# Patient Record
Sex: Male | Born: 1950 | Race: Black or African American | Hispanic: No | Marital: Married
Health system: Southern US, Community
[De-identification: ages and names within clinical notes are randomized; demographics above are authoritative.]

## PROBLEM LIST (undated history)

## (undated) DIAGNOSIS — I10 Essential (primary) hypertension: Secondary | ICD-10-CM

---

## 2004-05-19 ENCOUNTER — Ambulatory Visit: Payer: Self-pay

## 2004-05-19 ENCOUNTER — Other Ambulatory Visit: Payer: Self-pay

## 2004-05-22 ENCOUNTER — Other Ambulatory Visit: Payer: Self-pay

## 2004-05-22 ENCOUNTER — Inpatient Hospital Stay: Payer: Self-pay

## 2004-06-07 ENCOUNTER — Ambulatory Visit: Payer: Self-pay

## 2005-05-26 ENCOUNTER — Emergency Department: Payer: Self-pay | Admitting: Emergency Medicine

## 2005-05-26 ENCOUNTER — Other Ambulatory Visit: Payer: Self-pay

## 2006-02-13 ENCOUNTER — Ambulatory Visit: Payer: Self-pay | Admitting: Internal Medicine

## 2007-03-01 ENCOUNTER — Emergency Department: Payer: Self-pay | Admitting: Emergency Medicine

## 2007-06-14 ENCOUNTER — Emergency Department: Payer: Self-pay | Admitting: Emergency Medicine

## 2009-12-06 ENCOUNTER — Emergency Department: Payer: Self-pay | Admitting: Emergency Medicine

## 2011-10-23 ENCOUNTER — Inpatient Hospital Stay: Payer: Self-pay | Admitting: Internal Medicine

## 2011-10-23 DIAGNOSIS — J9600 Acute respiratory failure, unspecified whether with hypoxia or hypercapnia: Secondary | ICD-10-CM

## 2011-10-23 LAB — URINALYSIS, COMPLETE
Ketone: NEGATIVE
Leukocyte Esterase: NEGATIVE
Nitrite: NEGATIVE
Ph: 5 (ref 4.5–8.0)
Protein: >=500
Squamous Epithelial: NONE SEEN
WBC UR: 1 /HPF (ref 0–5)

## 2011-10-23 LAB — COMPREHENSIVE METABOLIC PANEL
Albumin: 3.6 g/dL (ref 3.4–5.0)
Bilirubin,Total: 0.8 mg/dL (ref 0.2–1.0)
Calcium, Total: 8.2 mg/dL — ABNORMAL LOW (ref 8.5–10.1)
Chloride: 107 mmol/L (ref 98–107)
Co2: 20 mmol/L — ABNORMAL LOW (ref 21–32)
EGFR (African American): 35 — ABNORMAL LOW
EGFR (Non-African Amer.): 30 — ABNORMAL LOW
Glucose: 175 mg/dL — ABNORMAL HIGH (ref 65–99)
Potassium: 3.4 mmol/L — ABNORMAL LOW (ref 3.5–5.1)
SGOT(AST): 37 U/L (ref 15–37)
SGPT (ALT): 51 U/L (ref 12–78)

## 2011-10-23 LAB — PROTIME-INR
INR: 1.1
Prothrombin Time: 14.7 secs (ref 11.5–14.7)

## 2011-10-23 LAB — CBC
MCHC: 31.9 g/dL — ABNORMAL LOW (ref 32.0–36.0)
Platelet: 229 10*3/uL (ref 150–440)
RBC: 4.89 10*6/uL (ref 4.40–5.90)
RDW: 15.9 % — ABNORMAL HIGH (ref 11.5–14.5)
WBC: 26.1 10*3/uL — ABNORMAL HIGH (ref 3.8–10.6)

## 2011-10-23 LAB — BASIC METABOLIC PANEL
BUN: 27 mg/dL — ABNORMAL HIGH (ref 7–18)
Calcium, Total: 8 mg/dL — ABNORMAL LOW (ref 8.5–10.1)
Co2: 22 mmol/L (ref 21–32)
EGFR (Non-African Amer.): 28 — ABNORMAL LOW
Glucose: 144 mg/dL — ABNORMAL HIGH (ref 65–99)
Osmolality: 293 (ref 275–301)
Sodium: 143 mmol/L (ref 136–145)

## 2011-10-23 LAB — PRO B NATRIURETIC PEPTIDE: B-Type Natriuretic Peptide: 2278 pg/mL — ABNORMAL HIGH (ref 0–125)

## 2011-10-24 LAB — CBC WITH DIFFERENTIAL/PLATELET
Basophil #: 0.1 10*3/uL (ref 0.0–0.1)
Basophil %: 0.4 %
Eosinophil #: 0 10*3/uL (ref 0.0–0.7)
Lymphocyte #: 0.5 10*3/uL — ABNORMAL LOW (ref 1.0–3.6)
Lymphocyte %: 3.5 %
MCH: 27.1 pg (ref 26.0–34.0)
Monocyte %: 6.9 %
Neutrophil #: 12.9 10*3/uL — ABNORMAL HIGH (ref 1.4–6.5)
Neutrophil %: 89.1 %
RBC: 3.77 10*6/uL — ABNORMAL LOW (ref 4.40–5.90)
RDW: 16.2 % — ABNORMAL HIGH (ref 11.5–14.5)
WBC: 14.5 10*3/uL — ABNORMAL HIGH (ref 3.8–10.6)

## 2011-10-24 LAB — BASIC METABOLIC PANEL
Anion Gap: 10 (ref 7–16)
BUN: 31 mg/dL — ABNORMAL HIGH (ref 7–18)
Chloride: 107 mmol/L (ref 98–107)
EGFR (African American): 26 — ABNORMAL LOW
EGFR (Non-African Amer.): 22 — ABNORMAL LOW
Osmolality: 292 (ref 275–301)
Potassium: 3.7 mmol/L (ref 3.5–5.1)
Sodium: 141 mmol/L (ref 136–145)

## 2011-10-24 LAB — PHOSPHORUS: Phosphorus: 3.3 mg/dL (ref 2.5–4.9)

## 2011-10-24 LAB — CK TOTAL AND CKMB (NOT AT ARMC)
CK, Total: 236 U/L — ABNORMAL HIGH (ref 35–232)
CK-MB: 1.2 ng/mL (ref 0.5–3.6)

## 2011-10-24 LAB — DRUG SCREEN, URINE
Amphetamines, Ur Screen: NEGATIVE (ref ?–1000)
Barbiturates, Ur Screen: NEGATIVE (ref ?–200)
Cannabinoid 50 Ng, Ur ~~LOC~~: NEGATIVE (ref ?–50)
Cocaine Metabolite,Ur ~~LOC~~: NEGATIVE (ref ?–300)
Opiate, Ur Screen: NEGATIVE (ref ?–300)
Tricyclic, Ur Screen: NEGATIVE (ref ?–1000)

## 2011-10-25 DIAGNOSIS — J9600 Acute respiratory failure, unspecified whether with hypoxia or hypercapnia: Secondary | ICD-10-CM

## 2011-10-25 LAB — CBC WITH DIFFERENTIAL/PLATELET
Basophil #: 0.1 10*3/uL (ref 0.0–0.1)
Basophil %: 0.7 %
Eosinophil #: 0 10*3/uL (ref 0.0–0.7)
Eosinophil %: 0.2 %
HCT: 30 % — ABNORMAL LOW (ref 40.0–52.0)
HGB: 9.4 g/dL — ABNORMAL LOW (ref 13.0–18.0)
Lymphocyte %: 4.2 %
MCH: 26.6 pg (ref 26.0–34.0)
MCHC: 31.4 g/dL — ABNORMAL LOW (ref 32.0–36.0)
Monocyte #: 0.9 x10 3/mm (ref 0.2–1.0)
Monocyte %: 7.5 %
Neutrophil #: 10.6 10*3/uL — ABNORMAL HIGH (ref 1.4–6.5)
Neutrophil %: 87.4 %
RBC: 3.54 10*6/uL — ABNORMAL LOW (ref 4.40–5.90)

## 2011-10-25 LAB — BASIC METABOLIC PANEL
Anion Gap: 12 (ref 7–16)
BUN: 38 mg/dL — ABNORMAL HIGH (ref 7–18)
Calcium, Total: 7.5 mg/dL — ABNORMAL LOW (ref 8.5–10.1)
Chloride: 107 mmol/L (ref 98–107)
Creatinine: 3.8 mg/dL — ABNORMAL HIGH (ref 0.60–1.30)
Osmolality: 295 (ref 275–301)
Potassium: 3.6 mmol/L (ref 3.5–5.1)

## 2011-10-25 LAB — PROTEIN ELECTROPHORESIS(ARMC)

## 2011-10-25 LAB — PROTEIN / CREATININE RATIO, URINE
Creatinine, Urine: 40 mg/dL (ref 30.0–125.0)
Protein/Creat. Ratio: 825 mg/gCREAT — ABNORMAL HIGH (ref 0–200)

## 2011-10-25 LAB — KAPPA/LAMBDA FREE LIGHT CHAINS (ARMC)

## 2011-10-25 LAB — MAGNESIUM: Magnesium: 1.6 mg/dL — ABNORMAL LOW

## 2011-10-25 LAB — PHOSPHORUS: Phosphorus: 3.6 mg/dL (ref 2.5–4.9)

## 2011-10-26 LAB — CBC WITH DIFFERENTIAL/PLATELET
Basophil #: 0 10*3/uL (ref 0.0–0.1)
Eosinophil #: 0.1 10*3/uL (ref 0.0–0.7)
Eosinophil %: 1.5 %
HCT: 30.3 % — ABNORMAL LOW (ref 40.0–52.0)
HGB: 9.9 g/dL — ABNORMAL LOW (ref 13.0–18.0)
Lymphocyte #: 0.4 10*3/uL — ABNORMAL LOW (ref 1.0–3.6)
MCV: 84 fL (ref 80–100)
Monocyte %: 6.9 %
Neutrophil #: 7.3 10*3/uL — ABNORMAL HIGH (ref 1.4–6.5)
Neutrophil %: 86.3 %
Platelet: 128 10*3/uL — ABNORMAL LOW (ref 150–440)
RBC: 3.63 10*6/uL — ABNORMAL LOW (ref 4.40–5.90)
WBC: 8.5 10*3/uL (ref 3.8–10.6)

## 2011-10-26 LAB — COMPREHENSIVE METABOLIC PANEL
Alkaline Phosphatase: 94 U/L (ref 50–136)
BUN: 36 mg/dL — ABNORMAL HIGH (ref 7–18)
Bilirubin,Total: 1.1 mg/dL — ABNORMAL HIGH (ref 0.2–1.0)
Chloride: 107 mmol/L (ref 98–107)
Co2: 23 mmol/L (ref 21–32)
Creatinine: 3 mg/dL — ABNORMAL HIGH (ref 0.60–1.30)
EGFR (Non-African Amer.): 22 — ABNORMAL LOW
Glucose: 272 mg/dL — ABNORMAL HIGH (ref 65–99)
Osmolality: 296 (ref 275–301)
Sodium: 139 mmol/L (ref 136–145)
Total Protein: 5.9 g/dL — ABNORMAL LOW (ref 6.4–8.2)

## 2011-10-26 LAB — UR PROT ELECTROPHORESIS, URINE RANDOM

## 2011-10-26 LAB — EXPECTORATED SPUTUM ASSESSMENT W GRAM STAIN, RFLX TO RESP C

## 2011-10-27 LAB — CBC WITH DIFFERENTIAL/PLATELET
Basophil %: 0.3 %
Eosinophil #: 0 10*3/uL (ref 0.0–0.7)
Eosinophil %: 0 %
HCT: 31.9 % — ABNORMAL LOW (ref 40.0–52.0)
HGB: 10.6 g/dL — ABNORMAL LOW (ref 13.0–18.0)
Lymphocyte %: 2.5 %
MCH: 27.8 pg (ref 26.0–34.0)
MCHC: 33.1 g/dL (ref 32.0–36.0)
Monocyte #: 0.2 x10 3/mm (ref 0.2–1.0)
Monocyte %: 2.8 %
Neutrophil %: 94.4 %
Platelet: 150 10*3/uL (ref 150–440)
RBC: 3.79 10*6/uL — ABNORMAL LOW (ref 4.40–5.90)
WBC: 7 10*3/uL (ref 3.8–10.6)

## 2011-10-27 LAB — BASIC METABOLIC PANEL
Chloride: 110 mmol/L — ABNORMAL HIGH (ref 98–107)
Co2: 22 mmol/L (ref 21–32)
Creatinine: 2.84 mg/dL — ABNORMAL HIGH (ref 0.60–1.30)
Osmolality: 302 (ref 275–301)
Potassium: 3.9 mmol/L (ref 3.5–5.1)
Sodium: 144 mmol/L (ref 136–145)

## 2011-10-27 LAB — MAGNESIUM: Magnesium: 2.5 mg/dL — ABNORMAL HIGH

## 2011-10-27 LAB — TRIGLYCERIDES: Triglycerides: 315 mg/dL — ABNORMAL HIGH (ref 0–200)

## 2011-10-28 LAB — BASIC METABOLIC PANEL
Anion Gap: 11 (ref 7–16)
Calcium, Total: 8.7 mg/dL (ref 8.5–10.1)
Co2: 24 mmol/L (ref 21–32)
EGFR (African American): 25 — ABNORMAL LOW
EGFR (Non-African Amer.): 22 — ABNORMAL LOW
Glucose: 194 mg/dL — ABNORMAL HIGH (ref 65–99)
Osmolality: 316 (ref 275–301)
Sodium: 147 mmol/L — ABNORMAL HIGH (ref 136–145)

## 2011-10-29 LAB — CULTURE, BLOOD (SINGLE)

## 2011-10-29 LAB — CBC WITH DIFFERENTIAL/PLATELET
Basophil #: 0 10*3/uL (ref 0.0–0.1)
Basophil %: 0.1 %
Eosinophil #: 0 10*3/uL (ref 0.0–0.7)
Eosinophil %: 0 %
HCT: 35.1 % — ABNORMAL LOW (ref 40.0–52.0)
Lymphocyte %: 5.9 %
MCH: 27.5 pg (ref 26.0–34.0)
MCHC: 33 g/dL (ref 32.0–36.0)
Monocyte #: 0.7 x10 3/mm (ref 0.2–1.0)
Monocyte %: 5.5 %
Neutrophil %: 88.5 %
Platelet: 223 10*3/uL (ref 150–440)
RBC: 4.2 10*6/uL — ABNORMAL LOW (ref 4.40–5.90)
WBC: 12.5 10*3/uL — ABNORMAL HIGH (ref 3.8–10.6)

## 2011-10-29 LAB — BASIC METABOLIC PANEL
Anion Gap: 14 (ref 7–16)
BUN: 71 mg/dL — ABNORMAL HIGH (ref 7–18)
Calcium, Total: 8.5 mg/dL (ref 8.5–10.1)
Creatinine: 2.95 mg/dL — ABNORMAL HIGH (ref 0.60–1.30)
EGFR (African American): 26 — ABNORMAL LOW
EGFR (Non-African Amer.): 22 — ABNORMAL LOW
Potassium: 3.9 mmol/L (ref 3.5–5.1)
Sodium: 146 mmol/L — ABNORMAL HIGH (ref 136–145)

## 2011-10-30 LAB — BASIC METABOLIC PANEL
Anion Gap: 12 (ref 7–16)
BUN: 67 mg/dL — ABNORMAL HIGH (ref 7–18)
Calcium, Total: 8.2 mg/dL — ABNORMAL LOW (ref 8.5–10.1)
Chloride: 114 mmol/L — ABNORMAL HIGH (ref 98–107)
Co2: 20 mmol/L — ABNORMAL LOW (ref 21–32)
Creatinine: 2.46 mg/dL — ABNORMAL HIGH (ref 0.60–1.30)
EGFR (African American): 32 — ABNORMAL LOW
Glucose: 125 mg/dL — ABNORMAL HIGH (ref 65–99)
Osmolality: 311 (ref 275–301)

## 2011-10-31 ENCOUNTER — Ambulatory Visit: Payer: Self-pay | Admitting: Oncology

## 2011-10-31 LAB — BASIC METABOLIC PANEL
Anion Gap: 11 (ref 7–16)
Chloride: 113 mmol/L — ABNORMAL HIGH (ref 98–107)
Co2: 21 mmol/L (ref 21–32)
Creatinine: 2.35 mg/dL — ABNORMAL HIGH (ref 0.60–1.30)
EGFR (African American): 34 — ABNORMAL LOW
Osmolality: 310 (ref 275–301)
Potassium: 3.7 mmol/L (ref 3.5–5.1)
Sodium: 145 mmol/L (ref 136–145)

## 2011-10-31 LAB — CBC WITH DIFFERENTIAL/PLATELET
Bands: 1 %
Comment - H1-Com2: NORMAL
HGB: 12.2 g/dL — ABNORMAL LOW (ref 13.0–18.0)
MCHC: 32.6 g/dL (ref 32.0–36.0)
Metamyelocyte: 1 %
NRBC/100 WBC: 1 /
Platelet: 278 10*3/uL (ref 150–440)
RDW: 16.7 % — ABNORMAL HIGH (ref 11.5–14.5)
Segmented Neutrophils: 85 %

## 2011-11-04 ENCOUNTER — Ambulatory Visit: Payer: Self-pay | Admitting: Oncology

## 2013-06-15 ENCOUNTER — Emergency Department: Payer: Self-pay | Admitting: Emergency Medicine

## 2013-06-15 LAB — CBC
HCT: 32.9 % — AB (ref 40.0–52.0)
HGB: 10.7 g/dL — ABNORMAL LOW (ref 13.0–18.0)
MCH: 26.5 pg (ref 26.0–34.0)
MCHC: 32.7 g/dL (ref 32.0–36.0)
MCV: 81 fL (ref 80–100)
Platelet: 116 10*3/uL — ABNORMAL LOW (ref 150–440)
RBC: 4.06 10*6/uL — ABNORMAL LOW (ref 4.40–5.90)
RDW: 14.7 % — ABNORMAL HIGH (ref 11.5–14.5)
WBC: 11.4 10*3/uL — AB (ref 3.8–10.6)

## 2013-06-15 LAB — BASIC METABOLIC PANEL
Anion Gap: 12 (ref 7–16)
BUN: 36 mg/dL — AB (ref 7–18)
CALCIUM: 8.9 mg/dL (ref 8.5–10.1)
Chloride: 105 mmol/L (ref 98–107)
Co2: 23 mmol/L (ref 21–32)
Creatinine: 3.62 mg/dL — ABNORMAL HIGH (ref 0.60–1.30)
EGFR (Non-African Amer.): 17 — ABNORMAL LOW
GFR CALC AF AMER: 20 — AB
Glucose: 123 mg/dL — ABNORMAL HIGH (ref 65–99)
OSMOLALITY: 289 (ref 275–301)
POTASSIUM: 4 mmol/L (ref 3.5–5.1)
Sodium: 140 mmol/L (ref 136–145)

## 2013-06-15 LAB — TROPONIN I
Troponin-I: 0.03 ng/mL
Troponin-I: 0.04 ng/mL

## 2013-06-20 LAB — CULTURE, BLOOD (SINGLE)

## 2013-12-24 ENCOUNTER — Emergency Department: Payer: Self-pay | Admitting: Emergency Medicine

## 2014-01-20 ENCOUNTER — Encounter: Payer: Self-pay | Admitting: Family Medicine

## 2014-02-03 ENCOUNTER — Encounter: Payer: Self-pay | Admitting: Family Medicine

## 2014-03-04 ENCOUNTER — Encounter
Admit: 2014-03-04 | Disposition: A | Discharge status: Home / Self Care | Payer: Self-pay | Attending: Family Medicine | Admitting: Family Medicine

## 2014-04-04 ENCOUNTER — Encounter
Admit: 2014-04-04 | Disposition: A | Discharge status: Home / Self Care | Payer: Self-pay | Attending: Family Medicine | Admitting: Family Medicine

## 2014-04-22 NOTE — Consult Note (Signed)
Referring Physician:  Ellin Saba   Primary Care Physician:  Ellin Saba : [facility], [address], [place], [place] [address]  Rogelia Rohrer : [facility], [address] [place], [place] [address], [phone]  Reason for Consult:  Admit Date: 23-Oct-2011   Reason for Consult: seizure   History of Present Illness:  History of Present Illness:   PATIENT NAME:  Hunter Gross, Hunter Gross [id number] OF BIRTH:  06/28/1950 OF ADMISSION:  10/23/2011 COMPLAINT: Severe shortness of breath.  OF PRESENT ILLNESS: African American man presented to [facility] because of shortness of breath and leg swelling.  He was found to be severely hypoxic and was intubated and sedated.  He was witnessed to have seizure activity with shaking.  There was concern for ongoing subclinical seizures and EEG was ordered stat.  The patient was thought to possibly have suffered brain hypoxia and/or ischemia due to his severe condition.  Neurology is asked to evaluate for seizure. OF SYSTEMS: A 10-point system review is unobtainable due to the patient is sedated and intubated.  MEDICAL HISTORY:  Unclear, patient is intubated and sedated. HISTORY: Unclear, patient is intubated and sedated. HISTORY: Unclear, patient is intubated and sedated. Diabetes and hypertension medications are reported.   No known drug allergies.  EXAMINATION:  GENERAL.and sedated. exam without pharmacologic dilation shows unreactive pupils. and S2 sounds are distant. - Obese- Normalcannot be ascertained due to medically induced coma.not have any response to noxious stim. STATUS.be ascertained due to medically induced coma. NERVES.be ascertained due to medically induced coma. be ascertained due to medically induced coma. response to noxious stim. be ascertained due to medically induced coma. DIAGNOSTIC AND RADIOLOGICAL DATA: Chest x-ray revealed extensive opacification or  whitening of both lung fields. This is so extensive that I cannot see even the borders of the heart. EKG showed normal sinus rhythm at rate of 83 per minute, first degree AV block. Poor progression of R waves in the anterior chest leads. Otherwise unremarkable EKG. Serum glucose 175.0. B-type natriuretic peptide (BNP) was 2278. BUN 27, creatinine 2.2, sodium 141, potassium 3.4, bicarbonate low at 20, anion gap was 14.0. His liver function tests were normal. Troponin was normal at 0.03. CBC showed white count of 26,000, hemoglobin 13, hematocrit 41, platelet count 229.0. Prothrombin time 14, INR 1.1, APTT 25.0. ABG showed pH of 7.14, pCO2 62.  Strangely, PaO2 was not reported but saturation was 68%. This is on 100% FiO2.  EEG - Personally interpreted by myself.  There is occasional bilaterally independent frontocentral interictal epileptiform discharges seen.  There are no seizures identified during this 30 minute study. African American man presented to [facility] because of shortness of breath and leg swelling.  He was found to be severely hypoxic and was intubated and sedated.  He was witnessed to have seizure activity with shaking.   the findings on EEG, I would recommend loading with Keppra 1g.  I would repeat with 500 mg if the patient is seen to have more seizure like activity.  Continuous monitoring should be considered.  If his seizures become more frequent after coming down and off of sedation, then he might require transfer to a facility with continuous EEG capability.  The poor level of responsiveness with new seizure raises the possibility of brain insult such as hypoxic ischemic injury.  Would recommend a repeat noncontrast HCT in a few days since his initial HCT is unremarkable.  I have discussed  the stat results of the EEG with the primary team within 15 minutes of completing the study. you for this interesting consultation. Melrose Nakayama, MD   Past Medical/Surgical Hx:  htn:   diabetes:   dernies:    Home Medications: Medication Instructions Last Modified Date/Time  simvastatin 20 milligram(s) orally once (at bedtime) 20-Oct-13 17:01  Lantus 40 unit(s) subcutaneous once a day (at bedtime) 20-Oct-13 17:01  Novolin R 15 unit(s)  3 times a day 20-Oct-13 17:01  hydrALAZINE 10 mg oral tablet tab(s) orally 3 times a day 20-Oct-13 17:01  allopurinol 100 mg oral tablet tab(s) orally 4 times a day 20-Oct-13 17:01  ergocalciferol 50,000 intl units (1.25 mg) oral capsule cap(s) orally once a week 20-Oct-13 17:01  Tagamet HB 200 mg oral tablet 1 tab(s) orally once a day 20-Oct-13 17:01  NIFEdipine 60 mg oral tablet, extended release 1 tab(s) orally once a day 20-Oct-13 17:01  metoprolol tartrate 100 mg oral tablet 1 tab(s) orally 2 times a day 20-Oct-13 17:01  Colace 100 mg oral capsule 1 cap(s) orally 2 times a day 20-Oct-13 17:01  Lasix 40 mg oral tablet 2 tab(s) orally 2 times a day 20-Oct-13 17:01   Allergies:  No Known Allergies:   Vital Signs: **Vital Signs.:   21-Oct-13 17:00   Pulse Pulse 100   Respirations Respirations 17   Systolic BP Systolic BP 333   Diastolic BP (mmHg) Diastolic BP (mmHg) 64   Mean BP 92   Pulse Ox % Pulse Ox % 93   Oxygen Delivery Ventilator Assisted   Pulse Ox Heart Rate 100   CVP (mm Hg) 8   Lab Results: Hepatic:  20-Oct-13 05:14    Bilirubin, Total 0.8   Alkaline Phosphatase 101   SGPT (ALT) 51   SGOT (AST) 37   Total Protein, Serum 7.0   Albumin, Serum 3.6  Routine Micro:  20-Oct-13 11:33    Micro Text Report BLOOD CULTURE   COMMENT                   NO GROWTH IN 18-24 HOURS   ANTIBIOTIC                        Culture Comment NO GROWTH IN 18-24 HOURS  Result(s) reported on 24 Oct 2011 at 06:31AM.    13:45    Micro Text Report BLOOD CULTURE   COMMENT                   NO GROWTH IN 8-12 HOURS   ANTIBIOTIC                        Culture Comment NO GROWTH IN 8-12 HOURS  Result(s) reported on 24 Oct 2011 at 06:31AM.  21-Oct-13 08:15     Micro Text Report SPUTUM CULTURE   GRAM STAIN                FEW WHITE BLOOD CELLS   GRAM STAIN                FEW GRAM POSITIVE COCCI IN PAIRS   GRAM STAIN                FEW GRAM POSITIVE COCCI IN CLUSTERS   ANTIBIOTIC                         Specimen Source BRONCH WASHING   Gram Stain  1 FEW WHITE BLOOD CELLS   Gram Stain 2 FEW GRAM POSITIVE COCCI IN PAIRS   Gram Stain 3 FEW GRAM POSITIVE COCCI IN CLUSTERS  Result(s) reported on 24 Oct 2011 at 12:21PM.  Lab:  20-Oct-13 05:45    pH (ABG)  7.14   PCO2  62   FiO2 100   Base Excess  -8.7   HCO3  21.1   O2 Saturation  68   Specimen Site (ABG) RT RADIAL   Specimen Type (ABG) ARTERIAL   Patient Temp (ABG) 37.0   Mode ASSIST CONTROL   Vt 600   PEEP 14.0   Mechanical Rate 20    08:25    pH (ABG) 7.40   PCO2  34   PO2  54   FiO2 100   Base Excess -3.0   HCO3  21.1   O2 Saturation 92.2   O2 Device 840   Specimen Site (ABG) LT BRACHIAL   Specimen Type (ABG) ARTERIAL   Patient Temp (ABG) 37.0   Mode PRESSURE CONTROL   Vt 600   PEEP 14.0   Mechanical Rate 24 (Result(s) reported on 23 Oct 2011 at 08:37AM.)  21-Oct-13 03:45    pH (ABG)  7.51   PCO2  28   PO2  308   FiO2 100   Base Excess 0.4   HCO3 22.3   O2 Saturation 99.1   O2 Device 840   Specimen Site (ABG) RT RADIAL   Specimen Type (ABG) ARTERIAL   Patient Temp (ABG) 37.0   Mode ASSIST CONTROL   Vt 600   PEEP 14.0   Mechanical Rate 24  Cardiology:  20-Oct-13 08:47    Echo Doppler  Interpretation Summary   The left ventricular ejection fraction is normal. Ejection Fraction =  >55%. The left ventricular wall motion is normal. The right  ventricular systolic function is normal. The left atrial size is  normal. Right ventricular systolic pressure is elevated at  40-79mHg. Elevated RVSP consistent with mild to moderate pulmonary  HTN.   PatientHeight: 170 cm   PatientWeight: 91 kg   SystolicPressure: 1016mmHg   DiastolicPressure: 76 mmHg   HeartRate: 72  bpm   BSA: 2.0 m2  Procedure:   A two-dimensional transthoracic echocardiogram with color flow and  Doppler was performed.  Left Ventricle   The left ventricle is normal in size.   There is mild concentric left ventricular hypertrophy.   The left ventricular ejection fraction is normal.   Ejection Fraction = >55%.   The transmitral spectral Doppler flow pattern is normal for age.   The left ventricular wall motion is normal.  Right Ventricle   The right ventricle is normal size.   The rightventricular systolic function is normal.  Atria   The left atrial size is normal.   Right atrial size is normal.   There is no Doppler evidence for an atrial septal defect.  Mitral Valve   The mitral valve leaflets appear normal. There is no evidence of  stenosis, fluttering, or prolapse.   There is no mitral valve stenosis.   There is no mitral regurgitation noted.  Tricuspid Valve   The tricuspid valve is normal.   There is mild tricuspid regurgitation.   Right ventricular systolic pressure is elevated at 40-579mg.  Aortic Valve   The aortic valve opens well.   No aortic regurgitation is present.   No hemodynamically significant valvular aortic stenosis.  Pulmonic Valve   The pulmonic valve leaflets are thin  and pliable; valve motion is  normal.   There is no pulmonic valvular regurgitation.  Great Vessels   The aortic root is normal size.   The pulmonary is not well visualized.  Pericardium/Pleural   There is no pleural effusion.   No pericardial effusion.  MMode 2D Measurements and Calculations   RVDd: 3.2 cm   IVSd: 1.1 cm   LVIDd: 4.1 cm   LVIDs: 2.9 cm   LVPWd: 1.2 cm   FS: 29 %   EF(Teich): 56 %   LVOT diam: 1.9 cm  Doppler Measurements and Calculations   MV E point: 52 cm/sec MV A point: 51 cm/sec   MV E/A: 1.0    MV dec time: 0.20 sec   Ao V2 max: 167 cm/sec   Ao max PG: 11 mmHg   AVA(V,D): 1.4 cm2   LV max PG: 3.0 mmHg   LV V1 max: 82 cm/sec   PA  V2 max: 125 cm/sec   PA max PG: 6.0 mmHg   TR Max vel: 275 cm/sec   TR Max PG: 30 mmHg   RVSP: 40 mmHg   RAP systole: 10 mmHg  Reading Physician: Ida Rogue  Sonographer: Dondra Spry Interpreting Physician:  Ida Rogue,  electronically signed on  10-24-2011 08:03:36 Requesting Physician: Ida Rogue  Routine Chem:  20-Oct-13 05:14    Glucose, Serum  175   BUN  27   Creatinine (comp)  2.28   Sodium, Serum 141   Potassium, Serum  3.4   Chloride, Serum 107   CO2, Serum  20   Calcium (Total), Serum  8.2   Anion Gap 14   Osmolality (calc) 291   eGFR (African American)  35   eGFR (Non-African American)  30 (eGFR values <5m/min/1.73 m2 may be an indication of chronic kidney disease (CKD). Calculated eGFR is useful in patients with stable renal function. The eGFR calculation will not be reliable in acutely ill patients when serum creatinine is changing rapidly. It is not useful in  patients on dialysis. The eGFR calculation may not be applicable to patients at the low and high extremes of body sizes, pregnant women, and vegetarians.)   B-Type Natriuretic Peptide ([facility]  2278 (Result(s) reported on 23 Oct 2011 at 06:01AM.)    05:45    Result Comment - po2<43  - HAND DELIVERED  - Dr SBeather Arbourin ed 10/23/11 0605  - NOTIFIED OF CRITICAL VALUE  Result(s) reported on 23 Oct 2011 at 06:17AM.    11:33    Result Comment Troponin - RESULTS VERIFIED BY REPEAT TESTING.  - c/vicki scott at 15.00 10-23-11 ljw  - READ-BACK PROCESS PERFORMED.  Result(s) reported on 23 Oct 2011 at 03:04PM.   Glucose, Serum  144   BUN  27   Creatinine (comp)  2.42   Sodium, Serum 143   Potassium, Serum 3.9   Chloride, Serum  109   CO2, Serum 22   Calcium (Total), Serum  8.0   Anion Gap 12   Osmolality (calc) 293   eGFR (African American)  33   eGFR (Non-African American)  28 (eGFR values <674mmin/1.73 m2 may be an indication of chronic kidney disease (CKD). Calculated eGFR is useful in  patients with stable renal function. The eGFR calculation will not be reliable in acutely ill patients when serum creatinine is changing rapidly. It is not useful in  patients on dialysis. The eGFR calculation may not be applicable to patients at the low and high extremes of body sizes, pregnant  women, and vegetarians.)    20:35    Result Comment TROPONIN - RESULTS VERIFIED BY REPEAT TESTING.  - PREVIOUS CALL: 10/23/11@1500 .Marland KitchenMarland KitchenTPL  Result(s) reported on 23 Oct 2011 at 09:21PM.  21-Oct-13 03:45    Result Comment - RESULTS REVIEWED ON SCREEN BY RECEIVER.  - dr. Mortimer Fries 10/24/2011  Result(s) reported on 24 Oct 2011 at 04:02AM.    04:13    Result Comment labs - This specimen was collected through an   - indwelling catheter or arterial line.  - A minimum of 73ms of blood was wasted prior    - to collecting the sample.  Interpret  - results with caution.  - tpl  Result(s) reported on 24 Oct 2011 at 04:51AM.   Glucose, Serum  176   BUN  31   Creatinine (comp)  2.94   Sodium, Serum 141   Potassium, Serum 3.7   Chloride, Serum 107   CO2, Serum 24   Calcium (Total), Serum  7.5   Anion Gap 10   Osmolality (calc) 292   eGFR (African American)  26   eGFR (Non-African American)  22 (eGFR values <638mmin/1.73 m2 may be an indication of chronic kidney disease (CKD). Calculated eGFR is useful in patients with stable renal function. The eGFR calculation will not be reliable in acutely ill patients when serum creatinine is changing rapidly. It is not useful in  patients on dialysis. The eGFR calculation may not be applicable to patients at the low and high extremes of body sizes, pregnant women, and vegetarians.)    14:21    Hemoglobin A1c ([facility])  7.9 (The American Diabetes Association recommends that a primary goal of therapy should be <7% and that physicians should reevaluate the treatment regimen in patients with HbA1c values consistently >8%.)   Phosphorus, Serum 3.3 (Result(s) reported on  24 Oct 2011 at 02:50PM.)  Urine Drugs:  [id number]-26520:[id number]  Tricyclic Antidepressant, Ur Qual (comp) NEGATIVE (Result(s) reported on 24 Oct 2011 at 10:04AM.)   Amphetamines, Urine Qual. NEGATIVE   MDMA, Urine Qual. NEGATIVE   Cocaine Metabolite, Urine Qual. NEGATIVE   Opiate, Urine qual NEGATIVE   Phencyclidine, Urine Qual. NEGATIVE   Cannabinoid, Urine Qual. NEGATIVE   Barbiturates, Urine Qual. NEGATIVE   Benzodiazepine, Urine Qual. NEGATIVE (----------------- The URINE DRUG SCREEN provides only a preliminary, unconfirmed analytical test result and should not be used for non-medical  purposes.  Clinical consideration and professional judgment should be  applied to any positive drug screen result due to possible interfering substances.  A more specific alternate chemical method must be used in order to obtain a confirmed analytical result.  Gas chromatography/mass spectrometry (GC/MS) is the preferred confirmatory method.)   Methadone, Urine Qual. NEGATIVE  Cardiac:  20-Oct-13 05:14    Troponin I 0.03 (0.00-0.05 0.05 ng/mL or less: NEGATIVE  Repeat testing in 3-6 hrs  if clinically indicated. >0.05 ng/mL: POTENTIAL  MYOCARDIAL INJURY. Repeat  testing in 3-6 hrs if  clinically indicated. NOTE: An increase or decrease  of 30% or more on serial  testing suggests a  clinically important change)    11:33    Troponin I  0.14 (0.00-0.05 0.05 ng/mL or less: NEGATIVE  Repeat testing in 3-6 hrs  if clinically indicated. >0.05 ng/mL: POTENTIAL  MYOCARDIAL INJURY. Repeat  testing in 3-6 hrs if  clinically indicated. NOTE: An increase or decrease  of 30% or more on serial  testing suggests a  clinically important change)    20:35  Troponin I  0.12 (0.00-0.05 0.05 ng/mL or less: NEGATIVE  Repeat testing in 3-6 hrs  if clinically indicated. >0.05 ng/mL: POTENTIAL  MYOCARDIAL INJURY. Repeat  testing in 3-6 hrs if  clinically indicated. NOTE: An increase or decrease  of 30%  or more on serial  testing suggests a  clinically important change)  21-Oct-13 10:57    CK, Total  236   CPK-MB, Serum 1.2 (Result(s) reported on 24 Oct 2011 at 11:22AM.)  Routine UA:  20-Oct-13 05:15    Color (UA) Straw   Clarity (UA) Clear   Glucose (UA) 50 mg/dL   Bilirubin (UA) Negative   Ketones (UA) Negative   Specific Gravity (UA) 1.010   Blood (UA) 1+   pH (UA) 5.0   Protein (UA) >=500   Nitrite (UA) Negative   Leukocyte Esterase (UA) Negative (Result(s) reported on 23 Oct 2011 at 06:53PM.)   RBC (UA) <1 /HPF   WBC (UA) 1 /HPF   Bacteria (UA) TRACE   Epithelial Cells (UA) NONE SEEN   Mucous (UA) PRESENT (Result(s) reported on 23 Oct 2011 at 06:53PM.)  Routine Coag:  20-Oct-13 05:14    Activated PTT (APTT) 25.9 (A HCT value >55% may artifactually increase the APTT. In one study, the increase was an average of 19%. Reference: "Effect on Routine and Special Coagulation Testing Values of Citrate Anticoagulant Adjustment in Patients with High HCT Values." American Journal of Clinical Pathology 2006;126:400-405.)   Prothrombin 14.7   INR 1.1 (INR reference interval applies to patients on anticoagulant therapy. A single INR therapeutic range for coumarins is not optimal for all indications; however, the suggested range for most indications is 2.0 - 3.0. Exceptions to the INR Reference Range may include: Prosthetic heart valves, acute myocardial infarction, prevention of myocardial infarction, and combinations of aspirin and anticoagulant. The need for a higher or lower target INR must be assessed individually. Reference: The Pharmacology and Management of the Vitamin K  antagonists: the seventh ACCP Conference on Antithrombotic and Thrombolytic Therapy. PQZRA.0762 Sept:126 (3suppl): [id number]. A HCT value >55% may artifactually increase the PT.  In one study,  the increase was an average of 25%. Reference:  "Effect on Routine and Special Coagulation Testing Values of  Citrate Anticoagulant Adjustment in Patients with High HCT Values." American Journal of Clinical Pathology 2006;126:400-405.)  Routine Hem:  20-Oct-13 05:14    WBC (CBC)  26.1   RBC (CBC) 4.89   Hemoglobin (CBC) 13.1   Hematocrit (CBC) 41.1   Platelet Count (CBC) 229 (Result(s) reported on 23 Oct 2011 at 05:53AM.)   MCV 84   MCH 26.8   MCHC  31.9   RDW  15.9  21-Oct-13 04:13    WBC (CBC)  14.5   RBC (CBC)  3.77   Hemoglobin (CBC)  10.2   Hematocrit (CBC)  31.4   Platelet Count (CBC)  131   MCV 83   MCH 27.1   MCHC 32.6   RDW  16.2   Neutrophil % 89.1   Lymphocyte % 3.5   Monocyte % 6.9   Eosinophil % 0.1   Basophil % 0.4   Neutrophil #  12.9   Lymphocyte #  0.5   Monocyte # 1.0   Eosinophil # 0.0   Basophil # 0.1   Radiology Results: CT:    21-Oct-13 16:31, CT Head Without Contrast   CT Head Without Contrast    REASON FOR EXAM:    seizures  COMMENTS:       PROCEDURE:  CT  - CT HEAD WITHOUT CONTRAST  - Oct 24 2011  4:31PM     RESULT: History: Seizure.    Comparison Study: No recent.    Findings: Standard nonenhanced CT obtained. No mass. No hydrocephalus. No   hemorrhage. Nasotracheal tube noted. No acute bony abnormality.    IMPRESSION:  No acute intracranial abnormality.        Verified By: Osa Craver, M.D., MD   Electronic Signatures: Anabel Bene (MD)  (Signed 21-Oct-13 17:40)  Authored: REFERRING PHYSICIAN, Primary Care Physician, Consult, History of Present Illness, PAST MEDICAL/SURGICAL HISTORY, HOME MEDICATIONS, ALLERGIES, NURSING VITAL SIGNS, LAB RESULTS, RADIOLOGY RESULTS   Last Updated: 21-Oct-13 17:40 by Anabel Bene (MD)

## 2014-04-22 NOTE — Consult Note (Signed)
General Aspect Respiratory failure    Present Illness The patient has no prior cardiac history.  However, he has a history of HTN.  He is intubated and sedated.  Per his family he had been caring for his wife who just died of breast cancer.  He now lives alone.  He does see an MD at the [place].  He has had HTN but by his brother's report he has not had heart disease.  He has no children.  He has been gaining weight and they have noted edema.  He has some chronic dyspnea but they know of no acute compliants.  He presented to the ER with acute respiratory failure.  BNP was elevated.  CXR with acute pulmonary edema.  He was reported to have pink frothy sputum.  He was hypertensive and required intubation and sedation.  His BP has since come down with IV Lasix and IV NTG.  FAMILY HISTORY:  No history of CHF or early CAD  SOCIAL:  Recentl widower.  No tobacco.  Does drink alcohol but not excessive per family   Physical Exam:   GEN obese, critically ill appearing, Intubated sedated    HEENT Intubated and on sedation    NECK supple  No masses  No bruits    RESP crackles    CARD Regular rate and rhythm  Normal, S1, S2  No murmur    ABD soft  distended  hypoactive BS  Obese    LYMPH negative neck    EXTR positive edema, Moderate lower extremity    SKIN normal to palpation    NEURO Intubated sedated    PSYCH sedated   Review of Systems:   ROS Pt not able to provide ROS    Medications/Allergies Reviewed Medications/Allergies reviewed     htn:    diabetes:   Home Medications: Medication Instructions Status  Lantus   once a day (at bedtime)  Active  Novolin R   3 times a day  Active  hydrochlorothiazide 50 mg oral tablet 1  orally   Active  labetalol 200 mg oral tablet 1  orally once a day  Active   Lab Results: Hepatic:  20-Oct-13 05:14    Bilirubin, Total 0.8   Alkaline Phosphatase 101   SGPT (ALT) 51   SGOT (AST) 37   Total Protein, Serum 7.0   Albumin, Serum 3.6   Lab:  20-Oct-13 05:45    pH (ABG)  7.14   PCO2  62   FiO2 100   Base Excess  -8.7   HCO3  21.1   O2 Saturation  68   Specimen Site (ABG) RT RADIAL   Specimen Type (ABG) ARTERIAL   Patient Temp (ABG) 37.0   Mode ASSIST CONTROL   Vt 600   PEEP 14.0   Mechanical Rate 20    08:25    pH (ABG) 7.40   PCO2  34   PO2  54   FiO2 100   Base Excess -3.0   HCO3  21.1   O2 Saturation 92.2   O2 Device 840   Specimen Site (ABG) LT BRACHIAL   Specimen Type (ABG) ARTERIAL   Patient Temp (ABG) 37.0   Mode PRESSURE CONTROL   Vt 600   PEEP 14.0   Mechanical Rate 24 (Result(s) reported on 23 Oct 2011 at 08:37AM.)  Routine Chem:  20-Oct-13 05:14    B-Type Natriuretic Peptide [facility])  2278 (Result(s) reported on 23 Oct 2011 at 06:01AM.)   Glucose,  Serum  175   BUN  27   Creatinine (comp)  2.28   Sodium, Serum 141   Potassium, Serum  3.4   Chloride, Serum 107   CO2, Serum  20   Calcium (Total), Serum  8.2   Osmolality (calc) 291   eGFR (African American)  35   eGFR (Non-African American)  30 (eGFR values <31m/min/1.73 m2 may be an indication of chronic kidney disease (CKD). Calculated eGFR is useful in patients with stable renal function. The eGFR calculation will not be reliable in acutely ill patients when serum creatinine is changing rapidly. It is not useful in  patients on dialysis. The eGFR calculation may not be applicable to patients at the low and high extremes of body sizes, pregnant women, and vegetarians.)   Anion Gap 14    05:45    Result Comment - po2<43  - HAND DELIVERED  - Dr SBeather Arbourin ed 10/23/11 0605  - NOTIFIED OF CRITICAL VALUE  Result(s) reported on 23 Oct 2011 at 06:17AM.  Cardiac:  20-Oct-13 05:14    Troponin I 0.03 (0.00-0.05 0.05 ng/mL or less: NEGATIVE  Repeat testing in 3-6 hrs  if clinically indicated. >0.05 ng/mL: POTENTIAL  MYOCARDIAL INJURY. Repeat  testing in 3-6 hrs if  clinically indicated. NOTE: An increase or decrease  of 30% or more  on serial  testing suggests a  clinically important change)  Routine Coag:  20-Oct-13 05:14    Activated PTT (APTT) 25.9 (A HCT value >55% may artifactually increase the APTT. In one study, the increase was an average of 19%. Reference: "Effect on Routine and Special Coagulation Testing Values of Citrate Anticoagulant Adjustment in Patients with High HCT Values." American Journal of Clinical Pathology 2006;126:400-405.)   Prothrombin 14.7   INR 1.1 (INR reference interval applies to patients on anticoagulant therapy. A single INR therapeutic range for coumarins is not optimal for all indications; however, the suggested range for most indications is 2.0 - 3.0. Exceptions to the INR Reference Range may include: Prosthetic heart valves, acute myocardial infarction, prevention of myocardial infarction, and combinations of aspirin and anticoagulant. The need for a higher or lower target INR must be assessed individually. Reference: The Pharmacology and Management of the Vitamin K  antagonists: the seventh ACCP Conference on Antithrombotic and Thrombolytic Therapy. CKGSUP.1031Sept:126 (3suppl): [id number] A HCT value >55% may artifactually increase the PT.  In one study,  the increase was an average of 25%. Reference:  "Effect on Routine and Special Coagulation Testing Values of Citrate Anticoagulant Adjustment in Patients with High HCT Values." American Journal of Clinical Pathology 2006;126:400-405.)  Routine Hem:  20-Oct-13 05:14    WBC (CBC)  26.1   RBC (CBC) 4.89   Hemoglobin (CBC) 13.1   Hematocrit (CBC) 41.1   Platelet Count (CBC) 229 (Result(s) reported on 23 Oct 2011 at 05:53AM.)   MCV 84   MCH 26.8   MCHC  31.9   RDW  15.9   EKG:   EKG Interp. by me    Interpretation NSR, rate 83, axis WNL, first degree AV block, poor anterior R wave progression, no acute ST T wave changes    No Known Allergies:   Vital Signs/Nurse's Notes: **Vital Signs.:   20-Oct-13 12:09    Vital Signs Type Routine   Temperature Source oral   Pulse Pulse 60   Pulse source if not from Vital Sign Device per cardiac monitor   Respirations Respirations 24   Systolic BP Systolic BP 1594  Diastolic BP (mmHg) Diastolic BP (mmHg) 70   Mean BP 93   Pulse Ox % Pulse Ox % 93   Oxygen Delivery 100%; Ventilator Assisted   Pulse Ox Heart Rate 62  *Intake and Output.:   Shift 20-Oct-13 15:00   Grand Totals Intake:  871 Output:  1625    Net:  -754 24 Hr.:  -754   Nitroglycerin      In:  55   IV (Primary)      In:  50   IV (Primary)      In:  316   IV (Secondary)      In:  450   Urine ml     Out:  1625   Length of Stay Totals Intake:  871 Output:  1625    Net:  -754     Impression Acute respiratory failure: I agree with the current treatment including the IV heparin and NTG.  I would avoid beta blocker with acute pulmonary edema.  Avoid further ACE or ARB with RI.  Echo results pending.  I agree with current diuresis although we will need to watch his creat closely.  Cycle cardiac enzymes.  Vent management per primary team.  Leukocytosis: On antibiotics although there is not a clear pneumonia  HTN: Managed as above  CKD:   Follow as above.  Unkown whether this is acute on chronic.  We will follow closely with you.   Electronic Signatures: Minus Breeding (MD)  (Signed 20-Oct-13 13:59)  Authored: General Aspect/Present Illness, History and Physical Exam, Review of System, Past Medical History, Home Medications, Labs, EKG , Allergies, Vital Signs/Nurse's Notes, Impression/Plan   Last Updated: 20-Oct-13 13:59 by Minus Breeding (MD)

## 2014-04-22 NOTE — Discharge Summary (Signed)
PATIENT NAME:  Hunter Gross, Hunter Gross MR#:  [id number] DATE OF BIRTH:  1950/10/24  DATE OF ADMISSION:  10/23/2011 DATE OF DISCHARGE:  10/31/2011  ADMITTING PHYSICIAN: Wilfred Curtis, MD  DISCHARGING PHYSICIAN: Gladstone Lighter, MD  PRIMARY CARE PHYSICIAN: Not known.  CONSULTANTS: 1. Flora Lipps, MD - Pulmonary Critical Care. 2. Ida Rogue, MD [facility]. 3. Gurney Maxin, MD - Neurology.  4. Murlean Iba, MD - Nephrology. 5. Delight Hoh, MD - Oncology.    DISCHARGE DIAGNOSES: 1. Acute respiratory failure.  2. Pulmonary edema.  3. Pneumonia. 4. Acute on chronic diastolic congestive heart failure.  5. Malignant hypertension, just taken off of nicardipine drip yesterday, 10/30/2011.  6. Diabetes mellitus.  7. Acute renal failure.  8. Chronic kidney disease with baseline creatinine of 1.5.  9. Elevated M spike on urine protein electrophoresis. Patient followup with oncology is recommended at this time for possible bone marrow biopsy, but serum electrophoresis is normal without any protein spikes. 10. Seizures while on vent, likely from anoxic encephalopathy. 11. Secondary hyperparathyroidism.  DISCHARGE MEDICATIONS: 1. IV hydralazine 20 mg every six hours.  2. Lasix 40 mg daily.  3. Ergocalciferol 50,000 international units orally once a week.  4. Flonase 2 puffs twice a day. 5. Vecuronium 10 mg IV every one hour p.r.n. 6. Senna 8.8 mg/5 L - 10 mL p.o. twice a day.  7. Lactulose 30 mL every 8 hours.  8. Pepcid 20 mg IV twice a day. 9. Reglan 5 mg IV three times daily prior to meals.  10. Amlodipine 10 mg p.o. daily.  11. Combivent inhaler per vent protocol.  12. Labetalol 300 mg p.o. twice a day. 13. Precedex IV drip while on vent.  14. Simvastatin 20 mg p.o. daily.  15. Keppra 500 mg IV twice a day.  16. Nitroglycerin 0.2 mg topical patch daily.  17. Subcutaneous heparin 5000 international units every 8 hours.  18. Methylprednisone 80 mg IV every six hours.   19. Tylenol 650 mg p.o. every four hours p.r.n. for pain or fever.  20. Morphine 2 mg IV every two hours p.r.n.  21. Scopolamine patch every three days.  22. Insulin Regular IV drip per CCU protocol while on the ventilator.  23. Ativan 2 mg IV push every 4 hours p.r.n. for anxiety.  24. Tube feeds - Jevity 1.5 calories continuous per dietician recommendations.  25. Pro-Stat powder 64 four times daily.   TRANSFER OXYGEN: Currently on vent on assist control mode, PEEP 5 and FiO2 24%.  FOLLOWUP INSTRUCTIONS: The patient will be transferred to Critical Care Unit at the [facility] in [place].   LABS/STUDIES: Chest x-ray on 10/31/2011 is showing persistent cardiomegaly with pulmonary vascular congestion and mild interstitial edema.   Sodium 145, potassium 3.7, chloride 113, bicarbonate 21, BUN 68, creatinine 2.35, glucose 128, and calcium 8.3.   WBC 15.3, hemoglobin 12.2, hematocrit 37.4, and platelet count 278.  Abdominal x-ray done 10/28/2011 is showing moderate amount of stool and gas within the colon, but pattern does not appear obstructive. No evidence of small bowel obstruction seen.   Ultrasound of kidneys bilaterally is showing echogenic renal lesions consistent with medical renal disease. No hydronephrosis or bladder dysfunction seen. Bilateral renal possible masses for which triphasic CT is suggested when the patient is clinical capable of.   ANCA panel is negative. C3 complement level is within normal limits. C4 complement level is slightly elevated at 38. Free Kappa light chains are elevated at 26.23 mg/L. Hemoglobin A1C 7.9.  Hepatitis C antibody is negative. Parathyroid hormone level is 360. Protein electrophoresis of the serum is normal. ANA panel is negative.  Sputum culture is showing a few white blood cells and gram-positive cocci.  Urine tox screen was negative on admission.  BRIEF HOSPITAL COURSE: Hunter Gross is a 64 year old African American male with past medical  history significant for hypertension and diabetes whose wife recently passed away and who was brought into the hospital by his friend secondary to worsening bilateral pedal edema and also worsening shortness of breath. He was hypoxic and has failed high-flow nasal cannula and has been intubated on admission. Cause of intubation was noted to be possible acute pulmonary edema.  1. Acute hypoxic respiratory failure. He was intubated on 10/23/2011 and has remained on the vent since then. He is being followed by pulmonary critical care physician while in the hospital and has been on low vent settings at PEEP of 5 and FiO2 of 24% for the past three days, but has been failing weaning trials due to anxiety and tachypnea issues. He is on Precedex for sedation and has intermittent alertness when he does follow some commands. Spontaneous breathing trials are being continued at this time. His most recent ABG is from 10/30/2011 showing a pH of 7.32, pCO2 39, pO2 74, bicarbonate 20.1, and saturations of 93% on 24% FiO2. Likely cause of his respiratory failure could have been acute pulmonary edema on presentation. His Echo did show diastolic dysfunction with an ejection fraction of 50 to 55% with, impaired relaxation. He was diuresed with Lasix initially but that had to be changed to p.r.n. due to worsening renal function. At this time, chest x-ray is improved much more than from admission and he has been getting Lasix every day as needed. Steroids were also started to help with his underlying possible chronic obstructive pulmonary disease and he is on Solu-Medrol 80 IV every six hours at this time.  2. Possible pneumonia with low grade fevers. I am not sure if it was aspiration because he did have seizure episodes after intubation. He was on Zosyn and has just finished the course. On Zosyn he has been afebrile and being monitored at this time.  3. Acute pulmonary edema due to acute diastolic dysfunction. As mentioned above, he  was diuresed aggressively on admission, but now Lasix has been changed to daily p.r.n. Due to worsening renal failure, ACE inhibitor and ARB are stopped due to his renal insufficiency.  4. Malignant hypertension. He was on nicardipine drip due to uncontrolled blood pressures and nicardipine drip was stopped and he is currently on oral labetalol, Norvasc, nitroglycerin patch and also IV hydralazine pushes.  5. Acute on chronic renal failure. Baseline creatinine is found to be 1.5 but that was from 2008 and his renal ultrasound does show chronic kidney disease changes. His creatinine worsened as high as 3 and is down to 2.3 to 2.4 at this time. Nephrotoxins are being voided and nephrology is following the patient while he is here. He likely does have some diabetic nephropathy.  6. Possible seizures while in ICU. On the vent. Seen by neurology. EEG is showing epileptiform waves in the frontal areas. He could have suffered anoxic encephalopathy from being so hypoxic on admission. Unable to assess mental status at this time. He did receive IV Cerebyx loading dose and he is on IV Keppra per neurology recommendations at this time. CT of the head is negative for any cerebral edema.  7. Diabetes mellitus. He was on  Lantus at home, but while he is on the vent he is on Regular insulin drip per CCU protocol. His HbA1c is 7.9.  8. Constipation issues with decreased absorption and high residuals. KUB showed increased stool and gas in the abdomen without evidence of obstruction. He does have a firm abdomen which probably is his normal state. He does have good bowel sounds and while started on senna, Colace and lactulose he has been having decent bowel movements. Because of possible extubation in the next day or two, his tube feeds have not be resumed at this time.   Code status of the patient is FULL CODE. He is medically stable if can be transferred to a facility that can handle vent patients. His contact person is his  brother.   DISCHARGE CONDITION: Guarded but medically stable for transfer at this time.  DISCHARGE DISPOSITION: He will be transferred to the Critical Care Unit at [facility].   TIME SPENT ON DISCHARGE: 45 minutes.  ____________________________ Gladstone Lighter, MD rk:slb D: 10/31/2011 14:57:56 ET     T: 10/31/2011 15:26:41 ET        JOB#: [id number] cc: Gladstone Lighter, MD, <Dictator> [place] MD ELECTRONICALLY SIGNED 11/02/2011 14:15

## 2014-04-22 NOTE — H&P (Signed)
PATIENT NAME:  Hunter Gross, Keeon A MR#:  [id number] DATE OF BIRTH:  08-16-1950  DATE OF ADMISSION:  10/23/2011  PRIMARY CARE PHYSICIAN: Unknown.   REFERRING PHYSICIAN: Chiquita LothJade Sung, MD  CHIEF COMPLAINT: Severe shortness of breath.   HISTORY OF PRESENT ILLNESS: Mr. Hunter Gross is a 64 year old African American male who was brought to the hospital by his friend stating that over the last one week he has bilateral leg swelling and then the entire night last night was short of breath. This had progressed and was worse. By the time he came to the Emergency Department the patient was in severe dyspnea, and his oxygen saturation was in the 50s despite high-flow oxygen. The patient was then intubated, sedated, and connected to the ventilator. I do not have any information about his medical illness or his past medical history other than history of hypertension and diabetes and that he was on a fluid pill.   REVIEW OF SYSTEMS: A 10-point system review is unobtainable due to the patient is sedated and intubated and connected to ventilator.   PAST MEDICAL HISTORY: There are no prior admissions to this hospital for this patient. He had one visit to the ER for gout-like presentation. There is a vague history of hypertension and diabetes.   FAMILY HISTORY: Family history is unobtainable due to the patient being sedated and on ventilator.   SOCIAL HABITS: Social habits are unobtainable as above.   SOCIAL HISTORY: Social history is unobtainable as above.   ADMISSION MEDICATIONS: Again, we do not have information other than he has diabetes and hypertension and he is on a fluid pill, There is a list of medications that are not verified, mentioning simvastatin, Novolin R, Lantus, labetalol, hydrochlorothiazide, amlodipine. The nurse who saw the patient earlier before I saw him also could not verify the medications.  ALLERGIES: No known drug allergies.   PHYSICAL EXAMINATION:  VITAL SIGNS: Blood pressure 181/81,  respiratory rate was 20. The patient is now on the ventilator with setting of 20 respiratory rate per minute. Pulse is 80, temperature 97.5. His oxygen saturation earlier was in the 50s. Right now it is improving up to 88%. He is on FiO2 100% on the ventilator.   GENERAL APPEARANCE: A middle-aged male lying in bed, sedated on ventilator.   HEENT: Head: No pallor. No icterus. No cyanosis.  Ears, nose and throat: Hearing cannot be assessed due to the patient being sedated on ventilator. Nasal mucosa is unremarkable. Mouth: The tongue is protruding. An endotracheal tube is in. There is frothy sputum coming through the ET tube. The color of the frothy sputum is whitish-pinkish. Eyes: Examination revealed normal eyelids and conjunctivae, although the conjunctivae are congested. Pupils are about 3 to 4 mm, equal.   NECK: Supple. Trachea at midline. No thyromegaly. No cervical lymphadenopathy. No masses.   HEART: Exam revealed normal S1, S2. No S3 or S4. No murmur. No gallop. No carotid bruits.   RESPIRATORY: Examination revealed breathing is totally dependent on the ventilator. There are bilateral coarse crackles and scattered rhonchi.   ABDOMEN: Obese, soft without rigidity. No hepatosplenomegaly. No masses. There are four scar tissues on the right side of the midline and a couple of them close to the midline, appears to be from previous laparoscopic surgery perhaps. There is a small infraumbilical hernia midway between the symphysis pubis and the umbilicus. This is easily reducible.   SKIN: Examination revealed no ulcers, no subcutaneous nodules. There is extensive onychomycosis or fungal infection of all toenails.  MUSCULOSKELETAL: No joint swelling. No clubbing.   NEUROLOGICAL: Limited exam due to the patient being sedated and paralyzed on the ventilator, but there is no facial asymmetry. Earlier, the patient was observed walking into the Emergency Department.   PSYCHIATRIC: Evaluation cannot be  done due to the patient being sedated and ventilated.  LABORATORY, DIAGNOSTIC AND RADIOLOGICAL DATA: Chest x-ray revealed extensive opacification or whitening of both lung fields. This is so extensive that I cannot see even the borders of the heart. EKG showed normal sinus rhythm at rate of 83 per minute, first degree AV block. Poor progression of R waves in the anterior chest leads. Otherwise unremarkable EKG. Serum glucose 175.0. B-type natriuretic peptide (BNP) was 2278. BUN 27, creatinine 2.2, sodium 141, potassium 3.4, bicarbonate low at 20, anion gap was 14.0. His liver function tests were normal. Troponin was normal at 0.03. CBC showed white count of 26,000, hemoglobin 13, hematocrit 41, platelet count 229.0. Prothrombin time 14, INR 1.1, APTT 25.0. ABG showed pH of 7.14, pCO2 62.  Strangely, PaO2 was not reported but saturation was 68%. This is on 100% FiO2.   ASSESSMENT:  1. Acute respiratory failure.  2. Acute pulmonary edema. However, I cannot rule out completely underlying pneumonia, but likely his presentation is more consistent with decompensated congestive heart failure. 3. Congestive heart failure, likely acute on chronic systolic heart failure. This is pending further information if we can obtain any records and also to obtain an echocardiogram.  4. Combined respiratory acidosis and metabolic acidosis.  5. Leukocytosis.  6. Severe systemic hypertension.  7. Mild hypokalemia.  8. Diabetes mellitus. 9. Renal failure. At this point, I do not know whether this is acute or chronic.  10. Obesity.   PLAN:  1. The patient will be admitted to the Intensive Care Unit.  2. Intravenous Lasix started. I will continue IV diuresis using 40 mg of Lasix every 12 hours for the next two days.  3. Continue ventilatory management along with 100% FiO2. Repeat arterial blood gas to ensure improvement of his acidosis.  4. Blood pressure control using ACE inhibitor, enalapril or Vasotec.  5. Continue IV  nitroglycerin which was already started in the Emergency Department.  6. Since I am not sure if there is underlying pneumonia, I prefer to cover him with Rocephin 1 gram daily until further clarification with another chest x-ray after the improvement of his congestive state.  7. I will repeat his CBC and basic metabolic profile tomorrow morning. Potassium replacement. 8. Accu-Cheks and insulin sliding scale.   TIME SPENT:   Time spent evaluating this patient took more than one hour.   ____________________________ Carney [place]. Rudene Re, MD amd:cbb D: 10/23/2011 06:56:51 ET T: 10/23/2011 11:50:26 ET JOB#: [id number]  cc: Carney Corners. Rudene Re, MD, <Dictator> Zollie Scale MD ELECTRONICALLY SIGNED 10/23/2011 22:37

## 2014-04-22 NOTE — Consult Note (Signed)
History of Present Illness:   Reason for Consult Positive UIEP    HPI   Patient is a 64 year old male who presented to the emergency room approximately one week ago with complaint of shortness of breath.  He was subsequently intubated and sedated and has been on a ventilator ever since.  Patient was also noted to have acute renal failure.  Workup for his renal failure revealed a negative SIEP, but positive UIEP.  Patient remains intubated and sedated therefore review of systems is unobtainable.  No family members are present for assistance.  PFSH:   Additional Past Medical and Surgical History Past medical history: Possible hypertension and diabetes.  Family history: Unknown.  Social history: Unknown.   Review of Systems:   Performance Status (ECOG) 4    Review of Systems   Unobtainable, intubated and sedated  NURSING NOTES: **Vital Signs.:   28-Oct-13 11:00    Vital Signs Type: Routine    Temperature Source: oral    Pulse Pulse: 62    Pulse source if not from Vital Sign Device: per cardiac monitor    Respirations Respirations: 19    Systolic BP Systolic BP: 756    Diastolic BP (mmHg) Diastolic BP (mmHg): 69    Mean BP: 92    Pulse Ox % Pulse Ox %: 95    Oxygen Delivery: Ventilator Assisted; Fio2 24%    Pulse Ox Heart Rate: 62   Physical Exam:   Physical Exam General: Intubated and sedated. HEENT: ET tube in place. Lungs: Clear to auscultation bilaterally. Heart: Regular rate and rhythm. No rubs, murmurs, or gallops. Abdomen: Soft, Mildly distended, normoactive bowel sounds. Musculoskeletal: No edema, cyanosis, or clubbing. Neuro:  Sedated. Cranial nerves grossly intact. Skin: No rashes or petechiae noted.    No Known Allergies:     simvastatin: 20 milligram(s) orally once (at bedtime), Active, 0, None   Lantus: 40 unit(s) subcutaneous once a day (at bedtime), Active, 0, None   Novolin R: 15 unit(s)  3 times a day, Active, 0, None   hydrALAZINE 10  mg oral tablet: tab(s) orally 3 times a day, Active, 0, None   allopurinol 100 mg oral tablet: tab(s) orally 4 times a day, Active, 0, None   ergocalciferol 50,000 intl units (1.25 mg) oral capsule: cap(s) orally once a week, Active, 0, None   Tagamet HB 200 mg oral tablet: 1 tab(s) orally once a day, Active, 0, None   NIFEdipine 60 mg oral tablet, extended release: 1 tab(s) orally once a day, Active, 0, None   metoprolol tartrate 100 mg oral tablet: 1 tab(s) orally 2 times a day, Active, 0, None   Colace 100 mg oral capsule: 1 cap(s) orally 2 times a day, Active, 0, None   Lasix 40 mg oral tablet: 2 tab(s) orally 2 times a day, Active, 0, None  Laboratory Results:  Routine Chem:  28-Oct-13 03:35    Glucose, Serum  128   BUN  68   Creatinine (comp)  2.35   Sodium, Serum 145   Potassium, Serum 3.7   Chloride, Serum  113   CO2, Serum 21   Calcium (Total), Serum  8.3   Anion Gap 11   Osmolality (calc) 310   eGFR (African American)  34   eGFR (Non-African American)  29 (eGFR values <78m/min/1.73 m2 may be an indication of chronic kidney disease (CKD). Calculated eGFR is useful in patients with stable renal function. The eGFR calculation will not be reliable in acutely ill patients  when serum creatinine is changing rapidly. It is not useful in  patients on dialysis. The eGFR calculation may not be applicable to patients at the low and high extremes of body sizes, pregnant women, and vegetarians.)   Result Comment LABS - This specimen was collected through an   - indwelling catheter or arterial line.  - A minimum of 29ms of blood was wasted prior    - to collecting the sample.  Interpret  - results with caution.  Result(s) reported on 31 Oct 2011 at 04:06AM.  Routine Hem:  28-Oct-13 03:35    WBC (CBC)  15.3   RBC (CBC) 4.53   Hemoglobin (CBC)  12.2   Hematocrit (CBC)  37.4   Platelet Count (CBC) 278 (Result(s) reported on 31 Oct 2011 at 05:18AM.)   MCV 83   MCH 27.0    MCHC 32.6   RDW  16.7   Bands 1   Segmented Neutrophils 85   Lymphocytes 9   Monocytes 4   Metamyelocyte 1   NRBC 1   Diff Comment 1 ANISOCYTOSIS   Diff Comment 2 NORMAL PLT MORPHOLGY  Result(s) reported on 31 Oct 2011 at 05:18AM.   Assessment and Plan:  Impression:   MGUS.  Plan:   1.  MGUS: Patient's M spike found in his urine is highly unlikely the etiology of his acute renal failure.  Patient does not likely have underlying multiple myeloma either.  Once patient is extubated and discharged from the hospital a full workup, including 24-hour urine collection and metastatic bone survey can be completed.  He does not require bone marrow biopsy at this time.  No intervention is needed at this time.  Have patient followup in the [facility] weeks after discharge for further evaluation and completion of his workup. consult, call with questions.  Electronic Signatures: FDelight Hoh(MD)  (Signed 28-Oct-13 13:44)  Authored: HISTORY OF PRESENT ILLNESS, PFSH, ROS, NURSING NOTES, PE, ALLERGIES, HOME MEDICATIONS, LABS, ASSESSMENT AND PLAN   Last Updated: 28-Oct-13 13:44 by FDelight Hoh(MD)

## 2016-06-21 ENCOUNTER — Emergency Department
Admission: EM | Admit: 2016-06-21 | Discharge: 2016-06-21 | Disposition: A | Discharge status: Home / Self Care | Payer: Medicare Other | Attending: Emergency Medicine | Admitting: Emergency Medicine

## 2016-06-21 ENCOUNTER — Emergency Department: Payer: Medicare Other

## 2016-06-21 ENCOUNTER — Encounter: Payer: Self-pay | Admitting: Emergency Medicine

## 2016-06-21 DIAGNOSIS — Y929 Unspecified place or not applicable: Secondary | ICD-10-CM | POA: Insufficient documentation

## 2016-06-21 DIAGNOSIS — S0240FA Zygomatic fracture, left side, initial encounter for closed fracture: Secondary | ICD-10-CM | POA: Insufficient documentation

## 2016-06-21 DIAGNOSIS — I10 Essential (primary) hypertension: Secondary | ICD-10-CM | POA: Insufficient documentation

## 2016-06-21 DIAGNOSIS — Y999 Unspecified external cause status: Secondary | ICD-10-CM | POA: Diagnosis not present

## 2016-06-21 DIAGNOSIS — Y939 Activity, unspecified: Secondary | ICD-10-CM | POA: Insufficient documentation

## 2016-06-21 DIAGNOSIS — S06360A Traumatic hemorrhage of cerebrum, unspecified, without loss of consciousness, initial encounter: Secondary | ICD-10-CM | POA: Diagnosis not present

## 2016-06-21 DIAGNOSIS — S0990XA Unspecified injury of head, initial encounter: Secondary | ICD-10-CM | POA: Diagnosis present

## 2016-06-21 HISTORY — DX: Essential (primary) hypertension: I10

## 2016-06-21 MED ORDER — HYDROCODONE-ACETAMINOPHEN 5-325 MG PO TABS: 1.0000 | ORAL_TABLET | ORAL | 0 refills | Status: AC | PRN

## 2016-06-21 NOTE — ED Notes (Signed)
Patient transported to CT 
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
## 2016-06-21 NOTE — Discharge Instructions (Signed)
Please call the number provided for neurosurgery to arrange a follow-up appointment in 1 week for recheck/reevaluation. Return to the emergency department immediately for any increased headache, confusion, slurred speech, weakness or numbness, or any other symptom personally concerning to yourself. Give also suffered a facial bone fracture, please call the number provided for ENT to arrange a follow-up appointment.
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
## 2016-08-02 ENCOUNTER — Other Ambulatory Visit: Payer: Self-pay | Admitting: Student

## 2016-08-02 DIAGNOSIS — R29898 Other symptoms and signs involving the musculoskeletal system: Secondary | ICD-10-CM

## 2016-08-09 ENCOUNTER — Ambulatory Visit: Payer: Medicare Other

## 2016-08-17 ENCOUNTER — Ambulatory Visit: Payer: Non-veteran care

## 2016-10-03 DEATH — deceased
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
